# Patient Record
Sex: Male | Born: 1990 | Race: White | Hispanic: No | Marital: Single | Smoking: Never smoker
Health system: Southern US, Community
[De-identification: ages and names within clinical notes are randomized; demographics above are authoritative.]

---
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
## 2009-07-17 ENCOUNTER — Encounter: Admission: RE | Admit: 2009-07-17 | Discharge: 2009-07-17 | Payer: Self-pay | Admitting: Family Medicine

## 2010-08-07 ENCOUNTER — Emergency Department (HOSPITAL_COMMUNITY)
Admission: EM | Admit: 2010-08-07 | Discharge: 2010-08-07 | Payer: Self-pay | Source: Home / Self Care | Admitting: Emergency Medicine
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
## 2015-12-18 ENCOUNTER — Emergency Department (HOSPITAL_COMMUNITY)
Admission: EM | Admit: 2015-12-18 | Discharge: 2015-12-18 | Disposition: A | Discharge status: Home / Self Care | Payer: 59 | Attending: Emergency Medicine | Admitting: Emergency Medicine

## 2015-12-18 ENCOUNTER — Encounter (HOSPITAL_COMMUNITY): Payer: Self-pay | Admitting: *Deleted

## 2015-12-18 DIAGNOSIS — D72829 Elevated white blood cell count, unspecified: Secondary | ICD-10-CM | POA: Diagnosis not present

## 2015-12-18 DIAGNOSIS — M79662 Pain in left lower leg: Secondary | ICD-10-CM | POA: Diagnosis not present

## 2015-12-18 DIAGNOSIS — R000 Tachycardia, unspecified: Secondary | ICD-10-CM | POA: Insufficient documentation

## 2015-12-18 DIAGNOSIS — M79661 Pain in right lower leg: Secondary | ICD-10-CM | POA: Diagnosis not present

## 2015-12-18 DIAGNOSIS — R112 Nausea with vomiting, unspecified: Secondary | ICD-10-CM | POA: Diagnosis not present

## 2015-12-18 DIAGNOSIS — T672XXA Heat cramp, initial encounter: Secondary | ICD-10-CM | POA: Insufficient documentation

## 2015-12-18 LAB — COMPREHENSIVE METABOLIC PANEL
ALBUMIN: 5.3 g/dL — AB (ref 3.5–5.0)
ALT: 50 U/L (ref 17–63)
AST: 37 U/L (ref 15–41)
Alkaline Phosphatase: 73 U/L (ref 38–126)
Anion gap: 11 (ref 5–15)
BILIRUBIN TOTAL: 1.1 mg/dL (ref 0.3–1.2)
BUN: 23 mg/dL — AB (ref 6–20)
CO2: 24 mmol/L (ref 22–32)
CREATININE: 1.42 mg/dL — AB (ref 0.61–1.24)
Calcium: 10.1 mg/dL (ref 8.9–10.3)
Chloride: 99 mmol/L — ABNORMAL LOW (ref 101–111)
GFR calc Af Amer: >60 mL/min (ref >60)
GLUCOSE: 123 mg/dL — AB (ref 65–99)
POTASSIUM: 4.8 mmol/L (ref 3.5–5.1)
Sodium: 134 mmol/L — ABNORMAL LOW (ref 135–145)
TOTAL PROTEIN: 8.6 g/dL — AB (ref 6.5–8.1)

## 2015-12-18 LAB — URINALYSIS, ROUTINE W REFLEX MICROSCOPIC
GLUCOSE, UA: NEGATIVE mg/dL
HGB URINE DIPSTICK: NEGATIVE
Ketones, ur: 15 mg/dL — AB
Nitrite: NEGATIVE
Protein, ur: NEGATIVE mg/dL
SPECIFIC GRAVITY, URINE: 1.03 (ref 1.005–1.030)
pH: 5 (ref 5.0–8.0)

## 2015-12-18 LAB — CBC WITH DIFFERENTIAL/PLATELET
BASOS ABS: 0 10*3/uL (ref 0.0–0.1)
BASOS PCT: 0 %
Eosinophils Absolute: 0 10*3/uL (ref 0.0–0.7)
Eosinophils Relative: 0 %
HEMATOCRIT: 45 % (ref 39.0–52.0)
HEMOGLOBIN: 16.4 g/dL (ref 13.0–17.0)
LYMPHS PCT: 9 %
Lymphs Abs: 2.3 10*3/uL (ref 0.7–4.0)
MCH: 29.3 pg (ref 26.0–34.0)
MCHC: 36.4 g/dL — ABNORMAL HIGH (ref 30.0–36.0)
MCV: 80.5 fL (ref 78.0–100.0)
Monocytes Absolute: 2 10*3/uL — ABNORMAL HIGH (ref 0.1–1.0)
Monocytes Relative: 8 %
NEUTROS ABS: 20.5 10*3/uL — AB (ref 1.7–7.7)
NEUTROS PCT: 83 %
Platelets: 289 10*3/uL (ref 150–400)
RBC: 5.59 MIL/uL (ref 4.22–5.81)
RDW: 13 % (ref 11.5–15.5)
WBC: 24.8 10*3/uL — AB (ref 4.0–10.5)

## 2015-12-18 LAB — URINE MICROSCOPIC-ADD ON: RBC / HPF: NONE SEEN RBC/hpf (ref 0–5)

## 2015-12-18 MED ORDER — ONDANSETRON HCL 4 MG PO TABS: 4.0000 mg | ORAL_TABLET | Freq: Three times a day (TID) | ORAL | Status: AC | PRN

## 2015-12-18 MED ORDER — ONDANSETRON HCL 4 MG/2ML IJ SOLN
4.0000 mg | Freq: Once | INTRAMUSCULAR | Status: AC
  Administered 2015-12-18: 4 mg via INTRAVENOUS
  Filled 2015-12-18: qty 2

## 2015-12-18 MED ORDER — SODIUM CHLORIDE 0.9 % IV BOLUS (SEPSIS)
1000.0000 mL | Freq: Once | INTRAVENOUS | Status: AC
  Administered 2015-12-18: 1000 mL via INTRAVENOUS

## 2015-12-18 NOTE — Discharge Instructions (Signed)
Your white blood cell count was elevated today at 24,000.  This will need to be rechecked by your family doctor in the next week.  Also get your kidney function rechecked. Get rechecked immediately if you develop fevers, new concerning symptoms.   Heat Exhaustion Information WHAT IS HEAT EXHAUSTION? Heat exhaustion happens when your body gets overheated from hot weather or from exercise. Heat exhaustion makes the temperature of your skin and body go up. Your body cools itself by sweating. If you do not drink enough water to replace what you sweat, you lose too much water and salt. This makes it harder for your body to produce more sweat. When you do not sweat enough, your body cannot cool down, and heat exhaustion may result. Heat exhaustion can lead to heatstroke, which is a more serious illness. WHO IS AT RISK FOR HEAT EXHAUSTION? Anyone can get heat exhaustion. However, heat exhaustion is more likely when you are exercising or doing a physical activity. It is also more likely when you are in hot and humid weather or bright sunshine. Heat exhaustion is also more likely to develop in:  People who are age 63 or older.  Children.  People who have a medical condition such as heart disease, poor circulation, sickle cell disease, or high blood pressure.  People who have a fever.  People who are very overweight (obese).  People who are dehydrated from:  Drinking alcohol or caffeine.  Taking certain medicines, such as diuretics or stimulants. WHAT ARE THE SYMPTOMS OF HEAT EXHAUSTION? Symptoms of heat exhaustion include:  A body temperature of up to 104F (40C).  Moist, cool, and clammy skin.  Dizziness.  Headache.  Nausea.  Fatigue.  Thirst.  Dark-colored urine.  Rapid pulse or heartbeat.  Weakness.  Muscle cramps.  Confusion.  Fainting. WHAT SHOULD I DO IF I THINK I HAVE HEAT EXHAUSTION? If you think that you have heat exhaustion, call your health care provider.  Follow his or her instructions. You should also:  Call a friend or a family member and ask someone to stay with you.  Move to a cooler location, such as:  Into the shade.  In front of a fan.  Someplace that has air conditioning.  Lie down and rest.  Slowly drink nonalcoholic, caffeine-free fluids.  Take off any extra clothing or tight-fitting clothes.  Take a cool bath or shower, if possible. If you do not have access to a bath or shower, dab or mist cool water on your skin. WHY IS IT IMPORTANT TO TREAT HEAT EXHAUSTION? It is extremely important to take care of yourself and treat heat exhaustion as soon as possible. Untreated heat exhaustion can turn into heatstroke. Symptoms of heatstroke include:  A body temperature of 104F (40C) or higher.  Hot, red skin that may be dry or moist.  Severe headache.  Nausea and vomiting.  Muscle weakness and cramping.  Confusion.  Rapid breathing.  Fainting.  Seizure. These symptoms may represent a serious problem that is an emergency. Do not wait to see if the symptoms will go away. Get medical help right away. Call your local emergency services (911 in the [place] Do not drive yourself to the hospital. Heatstroke is a life-threatening condition that requires urgent medical treatment. Do not treat heatstroke at home. Heatstroke should be treated by a health care professional and may require hospitalization. At the hospital, you may need to receive fluids through an IV tube:  If you cannot drink any fluids.  If  you vomit any fluids that you drink.  If your symptoms do not get better after one hour.  If your symptoms get worse after one hour. HOW CAN I PREVENT HEAT EXHAUSTION?  Avoid outdoor activities on very hot or humid days.  Do not exercise or do other physical activity when you are not feeling well.  Drink plenty of nonalcoholic and caffeine-free fluids before and during physical activity.  Take frequent breaks for  rest during physical activity.  Wear light-colored, loose-fitting, and lightweight clothing in the heat.  Wear a hat and use sunscreen when exercising outdoors.  Avoid being outside during the hottest times of the day.  Check with your health care provider before you start any new activity, especially if you take medicine or have a medical condition.  Start any new activity slowly and work up to your fitness level. HOW CAN I HELP TO PROTECT ELDERLY RELATIVES AND NEIGHBORS FROM HEAT EXHAUSTION? People who are age 84 or older are at greater risk for heat exhaustion. Their bodies have a harder time adjusting to heat. They are also more likely to have a medical condition or be on medicines that increase their risk for heat exhaustion. They may get heat exhaustion indoors if the heat is high for several days. You can help to protect them during hot weather by:  Checking on them two or more times each day.  Making sure that they are drinking plenty of cool, nonalcoholic, and caffeine-free fluids.  Making sure that they use their air conditioner.  Taking them to a location where air conditioning is available.  Talking with their health care provider about their medical needs, medicines, and fluid requirements.   This information is not intended to replace advice given to you by your health care provider. Make sure you discuss any questions you have with your health care provider.   Document Released: 04/16/2008 Document Revised: 03/29/2015 Document Reviewed: 06/15/2014 Elsevier Interactive Patient Education 2016 Else[employer] Inc[employer]   Leukocytosis Leukocytosis means you have more white blood cells than normal. White blood cells are made in your bone marrow. The main job of white blood cells is to fight infection. Having too many white blood cells is a common condition. It can develop as a result of many types of medical problems. CAUSES  In some cases, your bone marrow may be normal, but it is  still making too many white blood cells. This could be the result of:  Infection.  Injury.  Physical stress.  Emotional stress.  Surgery.  Allergic reactions.  Tumors that do not start in the blood or bone marrow.  An inherited disease.  Certain medicines.  Pregnancy and labor. In other cases, you may have a bone marrow disorder that is causing your body to make too many white blood cells. Bone marrow disorders include:  Leukemia. This is a type of blood cancer.  Myeloproliferative disorders. These disorders cause blood cells to grow abnormally. SYMPTOMS  Some people have no symptoms. Others have symptoms due to the medical problem that is causing their leukocytosis. These symptoms may include:  Bleeding.  Bruising.  Fever.  Night sweats.  Repeated infections.  Weakness.  Weight loss. DIAGNOSIS  Leukocytosis is often found during blood tests that are done as part of a normal physical exam. Your caregiver will probably order other tests to help determine why you have too many white blood cells. These tests may include:  A complete blood count (CBC). This test measures all the types of blood  cells in your body.  Chest X-rays, urine tests (urinalysis), or other tests to look for signs of infection.  Bone marrow aspiration. For this test, a needle is put into your bone. Cells from the bone marrow are removed through the needle. The cells are then examined under a microscope. TREATMENT  Treatment is usually not needed for leukocytosis. However, if a disorder is causing your leukocytosis, it will need to be treated. Treatment may include:  Antibiotic medicines if you have a bacterial infection.  Bone marrow transplant. Your diseased bone marrow is replaced with healthy cells that will grow new bone marrow.  Chemotherapy. This is the use of drugs to kill cancer cells. HOME CARE INSTRUCTIONS  Only take over-the-counter or prescription medicines as directed by your  caregiver.  Maintain a healthy weight. Ask your caregiver what weight is best for you.  Eat foods that are low in saturated fats and high in fiber. Eat plenty of fruits and vegetables.  Drink enough fluids to keep your urine clear or pale yellow.  Get 30 minutes of exercise at least 5 times a week. Check with your caregiver before starting a new exercise routine.  Limit caffeine and alcohol.  Do not smoke.  Keep all follow-up appointments as directed by your caregiver. SEEK MEDICAL CARE IF:  You feel weak or more tired than usual.  You develop chills, a cough, or nasal congestion.  You lose weight without trying.  You have night sweats.  You bruise easily. SEEK IMMEDIATE MEDICAL CARE IF:  You bleed more than normal.  You have chest pain.  You have trouble breathing.  You have a fever.  You have uncontrolled nausea or vomiting.  You feel dizzy or lightheaded. MAKE SURE YOU:  Understand these instructions.  Will watch your condition.  Will get help right away if you are not doing well or get worse.   This information is not intended to replace advice given to you by your health care provider. Make sure you discuss any questions you have with your health care provider.   Document Released: 06/27/2011 Document Revised: 09/30/2011 Document Reviewed: 01/09/2015 Elsevier Interactive Patient Education [employer] Else[employer] Inc.

## 2015-12-18 NOTE — ED Notes (Signed)
Pt reports understanding of discharge information. No questions at time of discharge 

## 2015-12-18 NOTE — ED Notes (Signed)
Pt reports improvement in nausea after medications and fluids. MD provided pt with water and pt instructed on need for urine sample.

## 2015-12-18 NOTE — ED Provider Notes (Signed)
CSN: [id number]     Arrival date & time 12/18/15  1954 History   First MD Initiated Contact with Patient 12/18/15 2016     Chief Complaint  Patient presents with  . Emesis    Patient is a 25 y.o. male presenting with vomiting. The history is provided by the patient. No language interpreter was used.  Emesis  Tyler Harvey is a 25 y.o. male who presents to the Emergency Department complaining of heat exhaustion.  He cut it for a golf tournament today and was out in the heat from noon to 6. At the end of the day he began to feel nauseous, vomited twice, headache, cramping in bilateral calves and hips. He is concerned that he has heat exhaustion. He denies any fevers, chest pain, shortness of breath, abdominal pain, diarrhea.  History reviewed. No pertinent past medical history. History reviewed. No pertinent past surgical history. No family history on file. Social History  Substance Use Topics  . Smoking status: Never Smoker   . Smokeless tobacco: None  . Alcohol Use: Yes    Review of Systems  Gastrointestinal: Positive for vomiting.  All other systems reviewed and are negative.     Allergies  Review of patient's allergies indicates no known allergies.  Home Medications   Prior to Admission medications   Medication Sig Start Date End Date Taking? Authorizing Provider  ibuprofen (ADVIL,MOTRIN) 200 MG tablet Take 200 mg by mouth every 6 (six) hours as needed for moderate pain.   Yes Historical Provider, MD  ondansetron (ZOFRAN) 4 MG tablet Take 1 tablet (4 mg total) by mouth every 8 (eight) hours as needed for nausea or vomiting. 12/18/15   Tilden Fossa, MD   BP 119/77 mmHg  Pulse 68  Temp(Src) 98.8 F (37.1 C) (Oral)  Resp 18  Ht  (1.905 m)  Wt 230 lb (104.327 kg)  BMI 28.75 kg/m2  SpO2 100% Physical Exam  Constitutional: He is oriented to person, place, and time. He appears well-developed and well-nourished.  HENT:  Head: Normocephalic and atraumatic.   Cardiovascular: Regular rhythm.   No murmur heard. Tachycardiac  Pulmonary/Chest: Effort normal and breath sounds normal. No respiratory distress.  Abdominal: Soft. There is no tenderness. There is no rebound and no guarding.  Musculoskeletal: He exhibits no edema or tenderness.  Neurological: He is alert and oriented to person, place, and time.  Skin: Skin is warm and dry.  Psychiatric: He has a normal mood and affect. His behavior is normal.  Nursing note and vitals reviewed.   ED Course  Procedures (including critical care time) Labs Review Labs Reviewed  CBC WITH DIFFERENTIAL/PLATELET - Abnormal; Notable for the following:    WBC 24.8 (*)    MCHC 36.4 (*)    Neutro Abs 20.5 (*)    Monocytes Absolute 2.0 (*)    All other components within normal limits  COMPREHENSIVE METABOLIC PANEL - Abnormal; Notable for the following:    Sodium 134 (*)    Chloride 99 (*)    Glucose, Bld 123 (*)    BUN 23 (*)    Creatinine, Ser 1.42 (*)    Total Protein 8.6 (*)    Albumin 5.3 (*)    All other components within normal limits  URINALYSIS, ROUTINE W REFLEX MICROSCOPIC (NOT AT [facility]) - Abnormal; Notable for the following:    APPearance CLOUDY (*)    Bilirubin Urine SMALL (*)    Ketones, ur 15 (*)    Leukocytes, UA SMALL (*)  All other components within normal limits  URINE MICROSCOPIC-ADD ON - Abnormal; Notable for the following:    Squamous Epithelial / LPF 0-5 (*)    Bacteria, UA RARE (*)    Casts HYALINE CASTS (*)    All other components within normal limits    Imaging Review No results found. I have personally reviewed and evaluated these images and lab results as part of my medical decision-making.   EKG Interpretation None      MDM   Final diagnoses:  Heat cramp, initial encounter  Leukocytosis    Patient here for nausea, vomiting, malaise, cramping in bilateral lower extremities following being in the sun for most of the day. He is nontoxic appearing on  examination it is normothermic. Following antiemetics and IV fluids he feels significantly improved. CBC demonstrates leukocytosis, likely to margination from stress response due to heat. BMP with mild elevation of BUN/creatinine consistent with dehydration. Patient's symptoms have significantly improved in the department and his cramps have resolved. Presentation is not consistent with serious bacterial illness, rhabdomyolysis. Discussed with patient home care for heat illness. Discussed importance of patient follow up regarding leukocytosis. Close return precautions were discussed as well.    Tilden FossaElizabeth Ripken Rekowski, MD 12/19/15 2050

## 2015-12-18 NOTE — ED Notes (Signed)
Pt says that he caddied for a golf tournament today and he is concerned he may be dehydrated. Reports vomiting, body cramping and headache.

## 2016-01-01 ENCOUNTER — Ambulatory Visit (INDEPENDENT_AMBULATORY_CARE_PROVIDER_SITE_OTHER): Payer: 59 | Admitting: Sports Medicine

## 2016-01-01 ENCOUNTER — Encounter: Payer: Self-pay | Admitting: Sports Medicine

## 2016-01-01 VITALS — BP 123/70 | Ht 75.0 in | Wt 230.0 lb

## 2016-01-01 DIAGNOSIS — M778 Other enthesopathies, not elsewhere classified: Secondary | ICD-10-CM | POA: Diagnosis not present

## 2016-01-01 MED ORDER — MELOXICAM 15 MG PO TABS: ORAL_TABLET | ORAL | Status: AC

## 2016-01-01 NOTE — Progress Notes (Signed)
   Subjective:    Patient ID: Tyler Harvey, male    DOB: 06/17/1991, 25 y.o.   MRN: [id number]  HPI chief complaint: Left wrist pain  25 year old right-hand-dominant male comes in today complaining of 3 weeks of left wrist pain. He does not recall any specific injury but describes a gradual onset of pain after he played 99 holes of golf in a period of 4 days. He began to experience ulnar-sided wrist pain with activity. Minimal swelling. He saw his primary care physician last week and was placed into a cockup wrist brace which has been helpful. All of his pain is along the ulnar wrist. No pain elsewhere in the wrist. He is not taking any medication for this. He does have a history of a prior both bone wrist fracture which required reduction by Dr.Gramig in 2006. He denies numbness or tingling into the hand.  Past history reviewed Medications reviewed Allergies reviewed    Review of Systems    as above Objective:   Physical Exam  Well-developed, well-nourished. No acute distress.  Left wrist: Full range of motion. No effusion. No obvious soft tissue swelling. He is tender to palpation along the ECU tendon. No tenderness over the TFCC. No tenderness to palpation along the radial wrist. Slight reproducible pain with wrist flexion and ulnar deviation. Negative Tinel's. Good radial and ulnar pulses. Good grip strength.  Limited MSK ultrasound of the left wrist was performed. There some mild fluid around the distal ECU tendon but otherwise unremarkable.      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 I will start him on meloxicam 15 mg daily for 7 days then as needed. Like for him to refrain from golf for another 2-3 weeks after which time he can then resume activity as tolerated. Follow-up with me in 3 weeks if symptoms persist. If that is the case, I would consider further imaging at  that time. If symptoms resolve he may cancel his follow-up appointment and follow-up as needed.

## 2016-01-24 ENCOUNTER — Ambulatory Visit (INDEPENDENT_AMBULATORY_CARE_PROVIDER_SITE_OTHER): Payer: 59 | Admitting: Sports Medicine

## 2016-01-24 ENCOUNTER — Encounter (INDEPENDENT_AMBULATORY_CARE_PROVIDER_SITE_OTHER): Payer: Self-pay

## 2016-01-24 ENCOUNTER — Encounter: Payer: Self-pay | Admitting: Sports Medicine

## 2016-01-24 VITALS — BP 143/78 | HR 86 | Ht 75.0 in | Wt 230.0 lb

## 2016-01-24 DIAGNOSIS — Z9109 Other allergy status, other than to drugs and biological substances: Secondary | ICD-10-CM

## 2016-01-24 DIAGNOSIS — M778 Other enthesopathies, not elsewhere classified: Secondary | ICD-10-CM

## 2016-01-24 MED ORDER — PREDNISONE 10 MG PO TABS: ORAL_TABLET | ORAL | Status: AC

## 2016-01-24 NOTE — Progress Notes (Signed)
   Subjective:    Patient ID: Tyler Harvey, male    DOB: 10/04/1990, 25 y.o.   MRN: [id number]  HPI   Patient comes in today for follow-up on left wrist pain. Unfortunately, he is still having ulnar-sided wrist pain with activity. He did wear his brace for about a week. He played golf last week with only some minor pain but the majority of his pain occurs with any sort of ulnar deviation of the hand and wrist. He has noticed some mild swelling. He is also complaining of a rash on his arms and legs which is secondary to exposure to poison ivy a couple of days ago.    Review of Systems     Objective:   Physical Exam  Well-developed, well-nourished. No acute distress.  Left wrist: Full range of motion. No effusion. No soft tissue swelling. He is tender to palpation along the distal ECU tendon. No other bony or soft tissue tenderness to palpation. Good radial and ulnar pulses. Skin: Along the left anterior leg and left forearm there is a diffuse maculopapular rash with blistering. Surrounding erythema. No signs of infection. Rash is consistent with contact dermatitis from poison ivy      Assessment & Plan:   Persistent left wrist pain secondary to ECU tendinitis Contact dermatitis (poison ivy)  12 day Sterapred Dosepak to take as directed. This should not only clear up his poison ivy but will probably also help tremendously with his wrist. He is instructed to avoid aggravating activities and I want him to take 3-4 weeks off from golf. If symptoms persist much after that then I would consider further diagnostic imaging. Follow-up for ongoing or recalcitrant issues.

## 2016-01-30 ENCOUNTER — Ambulatory Visit: Payer: 59 | Admitting: Sports Medicine

## 2017-02-11 ENCOUNTER — Encounter: Payer: Self-pay | Admitting: Sports Medicine

## 2017-02-11 ENCOUNTER — Ambulatory Visit
Admission: RE | Admit: 2017-02-11 | Discharge: 2017-02-11 | Disposition: A | Discharge status: Home / Self Care | Payer: 59 | Source: Ambulatory Visit | Attending: Sports Medicine | Admitting: Sports Medicine

## 2017-02-11 ENCOUNTER — Other Ambulatory Visit: Payer: Self-pay | Admitting: Sports Medicine

## 2017-02-11 ENCOUNTER — Ambulatory Visit (INDEPENDENT_AMBULATORY_CARE_PROVIDER_SITE_OTHER): Payer: 59 | Admitting: Sports Medicine

## 2017-02-11 VITALS — BP 118/80 | Ht 75.0 in | Wt 235.0 lb

## 2017-02-11 DIAGNOSIS — R0781 Pleurodynia: Secondary | ICD-10-CM

## 2017-02-11 NOTE — Progress Notes (Signed)
Sports Medicine Clinic Note  Subjective:  26 yo M here with 6 weeks of mid-thoracic back pain.  Back Pain: -First noticed while golfing 6 weeks ago when he got his golf club stuck in the rough while in mid-swing -Pain was immediate and located on left, middle back. Nonradiating. -Has tried icing, NSAIDs PRN, and K-tape with moderate interval improvement -Denies numbness, weakness in BUEs -No SOB, dyspnea, pleuritic CP  Objective: There were no vitals taken for this visit.  General: comfortable, no acute distress CV: no active CP, pulses 2+ throughout Resp: no increased WOB Skin: no rash, erythema  Back Exam:  Inspection: Unremarkable  Motion: Flexion 90 deg, Extension 45 deg, Side Bending to 45 deg bilaterally,  Rotation to 45 deg bilaterally with mild pain rotating to right SLR laying: Negative  XSLR laying: Negative  Palpable tenderness: TTP mid thoracic paraspinal muscles to axilla along lat. Ribs mildly TTP along midthoracic region of axilla with compression test. Spine not TTP. FABER: negative. Sensory change: Gross sensation intact to all lumbar and sacral dermatomes.  Plantar-flexion: 5/5 Dorsi-flexion: 5/5 Eversion: 5/5 Inversion: 5/5  Leg strength  Quad: 5/5 Hamstring: 5/5 Hip flexor: 5/5 Hip abductors: 5/5  Gait unremarkable.  Assessment/Plan:  Mid Back Pain 2/2 Thoracic Muscle Strain History and exam c/w thoracic muscle strain. Given persistence of symptoms and compression test, may have small rib fracture which we will rule out with XR today.  -XR left ribs -Physical Therapy -Continue conservative home therapy -Follow-up 4-6 wks PRN  Addendum: X-rays reviewed. No obvious fracture. Proceed with physical therapy and follow-up in 4-6 weeks if still symptomatic. He is okay to resume activity as tolerated.

## 2017-03-03 DIAGNOSIS — M546 Pain in thoracic spine: Secondary | ICD-10-CM | POA: Diagnosis not present

## 2017-03-07 DIAGNOSIS — M546 Pain in thoracic spine: Secondary | ICD-10-CM | POA: Diagnosis not present

## 2017-03-19 DIAGNOSIS — M546 Pain in thoracic spine: Secondary | ICD-10-CM | POA: Diagnosis not present

## 2017-03-26 DIAGNOSIS — M546 Pain in thoracic spine: Secondary | ICD-10-CM | POA: Diagnosis not present

## 2020-08-28 ENCOUNTER — Encounter (HOSPITAL_COMMUNITY): Payer: Self-pay | Admitting: Psychiatry

## 2020-08-28 ENCOUNTER — Telehealth (INDEPENDENT_AMBULATORY_CARE_PROVIDER_SITE_OTHER): Payer: Self-pay | Admitting: Psychiatry

## 2020-08-28 DIAGNOSIS — E559 Vitamin D deficiency, unspecified: Secondary | ICD-10-CM | POA: Insufficient documentation

## 2020-08-28 DIAGNOSIS — F331 Major depressive disorder, recurrent, moderate: Secondary | ICD-10-CM | POA: Insufficient documentation

## 2020-08-28 DIAGNOSIS — F321 Major depressive disorder, single episode, moderate: Secondary | ICD-10-CM

## 2020-08-28 NOTE — Progress Notes (Signed)
Psychiatric Initial Adult Assessment   Patient Identification: Tyler Harvey MRN:  [id number] Date of Evaluation:  08/28/2020 Referral Source: primary care Chief Complaint:  establish care, depression Visit Diagnosis:    ICD-10-CM   1. Current moderate episode of major depressive disorder without prior episode [name])  F32.1    Virtual Visit via Video Note  I connected with Tyler Harvey on 08/28/20 at  2:00 PM EST by a video enabled telemedicine application and verified that I am speaking with the correct person using two identifiers.  Location: Patient: home Provider: home office   I discussed the limitations of evaluation and management by telemedicine and the availability of in person appointments. The patient expressed understanding and agreed to proceed.      I discussed the assessment and treatment plan with the patient. The patient was provided an opportunity to ask questions and all were answered. The patient agreed with the plan and demonstrated an understanding of the instructions.   The patient was advised to call back or seek an in-person evaluation if the symptoms worsen or if the condition fails to improve as anticipated.  I provided 28 minutes of non-face-to-face time during this encounter.   Thresa Ross, MD  History of Present Illness: 30 years old currently single Caucasian male referred by primary care physician for management of depression.  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  Has suffered from depression.  Patient has had depression over the last 1-1/2 years some ups and down but it has affected in the past his interest to play golf decreased interest withdrawn decreased motivation sadness with crying spells.  He sold all his golf clubs.  He was started on sertraline now it is at a dose of 100 mg he is feeling some better not suicidal there were some thoughts in the past of hopelessness but he does not feel like that.  He feels medication has  helped.  His dad has suffered from depression is 2 times suicide attempt including cutting his wrist for which reason patient want to make sure that he gets establish care with a psychiatrist and to review medications and depression.  He plans to schedule therapy as well  Does not endorse hopelessness as of now does not endorse excessive worries.  Patient does not endorse having any particular trigger for depression.  He has had some difficult childhood with his dad other than that he has a supportive mom and he plans to join his new job and is motivated as of now  Does not endorse psychotic symptoms manic symptoms or panic attacks  Does not endorse regular use of alcohol he sporadically uses alcohol 1-2 mixed drinks every 3 to 4 days  Aggravating factors; dad diagnosed with depression and suicide attempt some difficult childhood Modifying factors; current job mom Duration since last 1 to 2 years      Past Psychiatric History: denies  Previous Psychotropic Medications: No   Substance Abuse History in the last 12 months:  No.  Consequences of Substance Abuse: NA  Past Medical History: History reviewed. No pertinent past medical history. History reviewed. No pertinent surgical history.  Family Psychiatric History: Dad: depression  Family History: History reviewed. No pertinent family history.  Social History:   Social History   Socioeconomic History  . Marital status: Single    Spouse name: Not on file  . Number of children: Not on file  . Years of education: Not on file  . Highest education level: Not on  file  Occupational History  . Not on file  Tobacco Use  . Smoking status: Never Smoker  . Smokeless tobacco: Never Used  Substance and Sexual Activity  . Alcohol use: Yes  . Drug use: No  . Sexual activity: Not on file  Other Topics Concern  . Not on file  Social History Narrative  . Not on file   Social Determinants of Health   Financial Resource Strain: Not  on file  Food Insecurity: Not on file  Transportation Needs: Not on file  Physical Activity: Not on file  Stress: Not on file  Social Connections: Not on file    Additional Social History: grew up with parents and sister. Dad had depression, somewhat challenging in respect to that and he had attempted suicide in the past . Had become confrontive with dad at times. Later had lived with mom after he left home  Allergies:  No Known Allergies  Metabolic Disorder Labs: No results found for: HGBA1C, MPG No results found for: PROLACTIN No results found for: CHOL, TRIG, HDL, CHOLHDL, VLDL, LDLCALC No results found for: TSH  Therapeutic Level Labs: No results found for: LITHIUM No results found for: CBMZ No results found for: VALPROATE  Current Medications: Current Outpatient Medications  Medication Sig Dispense Refill  . ibuprofen (ADVIL,MOTRIN) 200 MG tablet Take 200 mg by mouth every 6 (six) hours as needed for moderate pain.    . meloxicam (MOBIC) 15 MG tablet Take one tablet a day for 7 days, then take as needed for pain.  Take with food. 40 tablet 0  . ondansetron (ZOFRAN) 4 MG tablet Take 1 tablet (4 mg total) by mouth every 8 (eight) hours as needed for nausea or vomiting. 8 tablet 0  . predniSONE (DELTASONE) 10 MG tablet Take as directed 48 tablet 0  . sertraline (ZOLOFT) 100 MG tablet 1 tablet     No current facility-administered medications for this visit.     Psychiatric Specialty Exam: Review of Systems  Cardiovascular: Negative for chest pain.  Neurological: Negative for tremors.  Psychiatric/Behavioral: Negative for agitation.    There were no vitals taken for this visit.There is no height or weight on file to calculate BMI.  General Appearance: Casual  Eye Contact:  Fair  Speech:  Clear and Coherent  Volume:  Normal  Mood:  Euthymic  Affect:  Congruent  Thought Process:  Goal Directed  Orientation:  Full (Time, Place, and Person)  Thought Content:  Logical   Suicidal Thoughts:  No  Homicidal Thoughts:  No  Memory:  Immediate;   Fair Recent;   Fair  Judgement:  Fair  Insight:  Present  Psychomotor Activity:  Normal  Concentration:  Concentration: Fair and Attention Span: Fair  Recall:  [employer] of Knowledge:Good  Language: Good  Akathisia:  No  Handed:    AIMS (if indicated):  not done  Assets:  Desire for Improvement Physical Health  ADL's:  Intact  Cognition: WNL  Sleep:  Fair   Screenings: [employer] Office Visit from 01/24/2016 in [facility] Office Visit from 01/01/2016 in [facility]  PHQ-2 Total Score 0 0      Assessment and Plan: as follows Major depressive disorder first episode or without any prior episode moderate to severe; he is recovering and does not endorse hopelessness he feels the medication is helping the plan not to increase it we can increase it if needed.  We do  recommend psychotherapy to schedule therapy to consider coping skills and any triggers related to past or other psychosocial issues contributing to depression He is not suicidal he plans to start playing golf again and get connected with things that he has left  Does not endorse using drugs does not endorse hopelessness or suicidal thoughts reviewed medications  Follow-up in 3 to 4 weeks or earlier if needed call to schedule therapy     Thresa Ross, MD 2/7/20222:31 PM

## 2020-09-25 ENCOUNTER — Encounter (HOSPITAL_COMMUNITY): Payer: Self-pay | Admitting: Psychiatry

## 2020-09-25 ENCOUNTER — Telehealth (INDEPENDENT_AMBULATORY_CARE_PROVIDER_SITE_OTHER): Payer: Self-pay | Admitting: Psychiatry

## 2020-09-25 DIAGNOSIS — F321 Major depressive disorder, single episode, moderate: Secondary | ICD-10-CM

## 2020-09-25 NOTE — Progress Notes (Signed)
[facility] Follow up visit  Patient Identification: Tyler Harvey MRN:  [id number] Date of Evaluation:  09/25/2020 Referral Source: primary care Chief Complaint:  Follow up  depression Visit Diagnosis:    ICD-10-CM   1. Current moderate episode of major depressive disorder without prior episode [facility])  F32.1     Virtual Visit via Video Note  I connected with Tyler Harvey on 09/25/20 at  4:00 PM EST by a video enabled telemedicine application and verified that I am speaking with the correct person using two identifiers.  Location: Patient: parked car Provider: home office   I discussed the limitations of evaluation and management by telemedicine and the availability of in person appointments. The patient expressed understanding and agreed to proceed.  History of Present Illness:    Observations/Objective:   Assessment and Plan:   Follow Up Instructions:    I discussed the assessment and treatment plan with the patient. The patient was provided an opportunity to ask questions and all were answered. The patient agreed with the plan and demonstrated an understanding of the instructions.   The patient was advised to call back or seek an in-person evaluation if the symptoms worsen or if the condition fails to improve as anticipated.  I provided 11  minutes of non-face-to-face time during this encounter.   Thresa Ross, MD   History of Present Illness: 30 years old currently single Caucasian male initially referred by primary care physician for management of depression.  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  Has suffered from depression.   zoloft was increased to 100mg  last visit, doing better, taking back interest in golf Feels mood is balanced and optimistic  Aggravating factors; dad diagnosed with depression and suicide attempt some difficult childhood Modifying factors; job, mom Duration since last 1 to 2 years      Past Psychiatric History:  denies  Previous Psychotropic Medications: No    Past Medical History: History reviewed. No pertinent past medical history. History reviewed. No pertinent surgical history.  Family Psychiatric History: Dad: depression  Family History: History reviewed. No pertinent family history.  Social History:   Social History   Socioeconomic History  . Marital status: Single    Spouse name: Not on file  . Number of children: Not on file  . Years of education: Not on file  . Highest education level: Not on file  Occupational History  . Not on file  Tobacco Use  . Smoking status: Never Smoker  . Smokeless tobacco: Never Used  Substance and Sexual Activity  . Alcohol use: Yes  . Drug use: No  . Sexual activity: Not on file  Other Topics Concern  . Not on file  Social History Narrative  . Not on file   Social Determinants of Health   Financial Resource Strain: Not on file  Food Insecurity: Not on file  Transportation Needs: Not on file  Physical Activity: Not on file  Stress: Not on file  Social Connections: Not on file      Allergies:  No Known Allergies  Metabolic Disorder Labs: No results found for: HGBA1C, MPG No results found for: PROLACTIN No results found for: CHOL, TRIG, HDL, CHOLHDL, VLDL, LDLCALC No results found for: TSH  Therapeutic Level Labs: No results found for: LITHIUM No results found for: CBMZ No results found for: VALPROATE  Current Medications: Current Outpatient Medications  Medication Sig Dispense Refill  . ibuprofen (ADVIL,MOTRIN) 200 MG tablet Take 200 mg by mouth every 6 (six)  hours as needed for moderate pain.    . meloxicam (MOBIC) 15 MG tablet Take one tablet a day for 7 days, then take as needed for pain.  Take with food. 40 tablet 0  . ondansetron (ZOFRAN) 4 MG tablet Take 1 tablet (4 mg total) by mouth every 8 (eight) hours as needed for nausea or vomiting. 8 tablet 0  . predniSONE (DELTASONE) 10 MG tablet Take as directed 48 tablet 0   . sertraline (ZOLOFT) 100 MG tablet 1 tablet     No current facility-administered medications for this visit.     Psychiatric Specialty Exam: Review of Systems  Cardiovascular: Negative for chest pain.  Neurological: Negative for tremors.  Psychiatric/Behavioral: Negative for agitation and suicidal ideas.    There were no vitals taken for this visit.There is no height or weight on file to calculate BMI.  General Appearance: Casual  Eye Contact:  Fair  Speech:  Clear and Coherent  Volume:  Normal  Mood:  Euthymic  Affect:  Congruent  Thought Process:  Goal Directed  Orientation:  Full (Time, Place, and Person)  Thought Content:  Logical  Suicidal Thoughts:  No  Homicidal Thoughts:  No  Memory:  Immediate;   Fair Recent;   Fair  Judgement:  Fair  Insight:  Present  Psychomotor Activity:  Normal  Concentration:  Concentration: Fair and Attention Span: Fair  Recall:  [employer] of Knowledge:Good  Language: Good  Akathisia:  No  Handed:    AIMS (if indicated):  not done  Assets:  Desire for Improvement Physical Health  ADL's:  Intact  Cognition: WNL  Sleep:  Fair   Screenings: PHQ2-9   Flowsheet Row Office Visit from 01/24/2016 in [facility] Office Visit from 01/01/2016 in [facility]  PHQ-2 Total Score 0 0      Assessment and Plan: as follows  Prior documentation reviewed  Major depressive disorder first episode or without any prior episode moderate to severe; doing better, continue zoloft  psychosocial issues contributing to depression and he is scheduled therapy with [facility]  Fu 63m. Has meds for now     Thresa Ross, MD 3/7/20224:19 PM

## 2021-01-30 ENCOUNTER — Ambulatory Visit (HOSPITAL_COMMUNITY): Payer: Self-pay | Admitting: Psychiatry
